# Patient Record
Sex: Male | Born: 1960 | Race: White | Hispanic: No | Marital: Single | Smoking: Never smoker
Health system: Southern US, Community
[De-identification: ages and names within clinical notes are randomized; demographics above are authoritative.]

## PROBLEM LIST (undated history)

## (undated) DIAGNOSIS — F419 Anxiety disorder, unspecified: Secondary | ICD-10-CM

## (undated) DIAGNOSIS — I447 Left bundle-branch block, unspecified: Secondary | ICD-10-CM

## (undated) DIAGNOSIS — I519 Heart disease, unspecified: Secondary | ICD-10-CM

## (undated) HISTORY — PX: KNEE SURGERY: SHX244

---

## 2016-12-28 ENCOUNTER — Encounter (HOSPITAL_BASED_OUTPATIENT_CLINIC_OR_DEPARTMENT_OTHER): Payer: Self-pay | Admitting: *Deleted

## 2016-12-28 ENCOUNTER — Emergency Department (HOSPITAL_BASED_OUTPATIENT_CLINIC_OR_DEPARTMENT_OTHER)
Admission: EM | Admit: 2016-12-28 | Discharge: 2016-12-28 | Disposition: A | Discharge status: Home / Self Care | Payer: Self-pay | Attending: Emergency Medicine | Admitting: Emergency Medicine

## 2016-12-28 ENCOUNTER — Emergency Department (HOSPITAL_BASED_OUTPATIENT_CLINIC_OR_DEPARTMENT_OTHER): Payer: Self-pay

## 2016-12-28 DIAGNOSIS — M25421 Effusion, right elbow: Secondary | ICD-10-CM | POA: Insufficient documentation

## 2016-12-28 NOTE — ED Provider Notes (Signed)
[facility]   CSN: [id number] Arrival date & time: 12/28/16  1200     History   Chief Complaint No chief complaint on file.   HPI Coleson Kant is a 56 y.o. male with no significant past medical history who presents a with chief complaint acute onset, moderately improving swelling to the right elbow. He states that on July 5 he was at work, working on an airplane above him with his right elbow leaning on the cement for 8 hours. He denies immediate onset of pain or swelling, but states that a few hours later he noted swelling to the right elbow. He denies any significant pain, numbness, tingling, or weakness. Has not tried anything for his symptoms. No aggravating or alleviating factors. No fevers or chills. Otherwise no trauma or falls.  The history is provided by the patient.    History reviewed. No pertinent past medical history.  There are no active problems to display for this patient.   Past Surgical History:  Procedure Laterality Date  . KNEE SURGERY         Home Medications    Prior to Admission medications   Not on File    Family History No family history on file.  Social History Social History  Substance Use Topics  . Smoking status: Never Smoker  . Smokeless tobacco: Never Used  . Alcohol use No     Allergies   Patient has no known allergies.   Review of Systems Review of Systems  Constitutional: Negative for chills and fever.  Musculoskeletal: Positive for joint swelling.  Neurological: Negative for weakness and numbness.     Physical Exam Updated Vital Signs BP (!) 144/93   Pulse 76   Temp 98.1 F (36.7 C) (Oral)   Resp 20   Ht 5\' 10"  (1.778 m)   Wt 106.6 kg (235 lb)   SpO2 98%   BMI 33.72 kg/m   Physical Exam  Constitutional: He appears well-developed and well-nourished. No distress.  HENT:  Head: Normocephalic and atraumatic.  Eyes: Conjunctivae are normal. Right eye exhibits no discharge. Left  eye exhibits no discharge.  Neck: No JVD present. No tracheal deviation present.  Cardiovascular: Normal rate.   2+ radial pulses bilaterally  Pulmonary/Chest: Effort normal.  Abdominal: He exhibits no distension.  Musculoskeletal: Normal range of motion. He exhibits no edema.       Right elbow: He exhibits effusion. He exhibits normal range of motion. No tenderness found.  10 cm x 5cm area of fluctuance and swelling at the area of the right olecranon bursa. Full range of motion of right elbow, 5/5 strength of BUE major muscle groups. No crepitus, erythema, or tenderness elicited   Neurological: He is alert. No sensory deficit.  Fluent speech, no facial droop, sensation intact to soft touch of BUE  Skin: Skin is warm and dry. No erythema.  Psychiatric: He has a normal mood and affect. His behavior is normal.  Nursing note and vitals reviewed.    ED Treatments / Results  Labs (all labs ordered are listed, but only abnormal results are displayed) Labs Reviewed - No data to display  EKG  EKG Interpretation None       Radiology Dg Elbow Complete Right  Result Date: 12/28/2016 CLINICAL DATA:  Right elbow swelling.  No reported injury. EXAM: RIGHT ELBOW - COMPLETE 3+ VIEW COMPARISON:  None. FINDINGS: No fracture, joint effusion or dislocation. No suspicious focal osseous lesion. Dorsal/radial side right elbow soft tissue  swelling. Small enthesophytes at the medial epicondyles in the right distal humerus. Mild osteoarthritis in the right elbow joint. No radiopaque foreign body. IMPRESSION: Dorsal/ radial side right elbow soft tissue swelling. No joint effusion, fracture or malalignment. Small enthesophytes at the medial epicondyle in the right distal humerus. Mild right elbow osteoarthritis . Electronically Signed   By: Delbert PhenixJason A Poff M.D.   On: 12/28/2016 12:37    Procedures Procedures (including critical care time)  Medications Ordered in ED Medications - No data to  display   Initial Impression / Assessment and Plan / ED Course  I have reviewed the triage vital signs and the nursing notes.  Pertinent labs & imaging results that were available during my care of the patient were reviewed by me and considered in my medical decision making (see chart for details).     Patient with swelling of the right olecranon bursa after prolonged pressure applied to the area 2 weeks ago. States the area has been improving slowly. Afebrile, vital signs are stable. No focal neurological deficits. Low suspicion of septic joint, gout, or bursitis in the absence of erythema or pain. X-ray reviewed by me shows right elbow soft tissue swelling with no joint effusion, fracture, or malalignment. There is evidence of mild right elbow osteoarthritis. He is stable for discharge home with Ace wrap for compression, RICE therapy indicated and discussed. 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 He will follow up with Dr. Pearletha ForgeHudnall the orthopedist for reevaluation if symptoms persist. Discussed indications for immediate return to the ED. Pt verbalized understanding of and agreement with plan and is safe for discharge home at this time.   Final Clinical Impressions(s) / ED Diagnoses   Final diagnoses:  Effusion of right olecranon bursa    New Prescriptions There are no discharge medications for this patient.    Jeanie SewerFawze, Taeshawn Helfman A, PA-C 12/28/16 1324    Geoffery Lyonselo, Douglas, MD 12/30/16 0221

## 2016-12-28 NOTE — ED Notes (Signed)
ED Provider at bedside. 

## 2016-12-28 NOTE — Discharge Instructions (Signed)
Apply compression to the elbow with an Ace wrap. Use elbow pads while at work to avoid excess pressure. He may take ibuprofen or Tylenol as needed for pain. Follow-up with orthopedics for reevaluation if symptoms persist. Return to the ED immediately if any concerning signs or symptoms develop such as fevers, redness, tenderness, or severe pain to the affected area.

## 2016-12-28 NOTE — ED Triage Notes (Signed)
He was standing under an airplane at work with the elbow leaning on cement. His right elbow is swollen. Happened July 5th. He is not sure it is workman's. To late to do a UDS per place of employment.

## 2018-01-22 ENCOUNTER — Emergency Department (HOSPITAL_BASED_OUTPATIENT_CLINIC_OR_DEPARTMENT_OTHER): Payer: Self-pay

## 2018-01-22 ENCOUNTER — Encounter (HOSPITAL_BASED_OUTPATIENT_CLINIC_OR_DEPARTMENT_OTHER): Payer: Self-pay | Admitting: *Deleted

## 2018-01-22 ENCOUNTER — Emergency Department (HOSPITAL_BASED_OUTPATIENT_CLINIC_OR_DEPARTMENT_OTHER)
Admission: EM | Admit: 2018-01-22 | Discharge: 2018-01-22 | Disposition: A | Discharge status: Home / Self Care | Payer: Self-pay | Attending: Emergency Medicine | Admitting: Emergency Medicine

## 2018-01-22 ENCOUNTER — Other Ambulatory Visit: Payer: Self-pay

## 2018-01-22 DIAGNOSIS — R0989 Other specified symptoms and signs involving the circulatory and respiratory systems: Secondary | ICD-10-CM

## 2018-01-22 DIAGNOSIS — F458 Other somatoform disorders: Secondary | ICD-10-CM | POA: Insufficient documentation

## 2018-01-22 HISTORY — DX: Heart disease, unspecified: I51.9

## 2018-01-22 HISTORY — DX: Left bundle-branch block, unspecified: I44.7

## 2018-01-22 HISTORY — DX: Anxiety disorder, unspecified: F41.9

## 2018-01-22 LAB — CBC WITH DIFFERENTIAL/PLATELET
BASOS PCT: 0 %
Basophils Absolute: 0 10*3/uL (ref 0.0–0.1)
EOS PCT: 2 %
Eosinophils Absolute: 0.1 10*3/uL (ref 0.0–0.7)
HEMATOCRIT: 46.5 % (ref 39.0–52.0)
HEMOGLOBIN: 16.3 g/dL (ref 13.0–17.0)
LYMPHS PCT: 28 %
Lymphs Abs: 1.8 10*3/uL (ref 0.7–4.0)
MCH: 29.3 pg (ref 26.0–34.0)
MCHC: 35.1 g/dL (ref 30.0–36.0)
MCV: 83.6 fL (ref 78.0–100.0)
MONOS PCT: 8 %
Monocytes Absolute: 0.5 10*3/uL (ref 0.1–1.0)
NEUTROS ABS: 4.1 10*3/uL (ref 1.7–7.7)
Neutrophils Relative %: 62 %
Platelets: 184 10*3/uL (ref 150–400)
RBC: 5.56 MIL/uL (ref 4.22–5.81)
RDW: 13.6 % (ref 11.5–15.5)
WBC: 6.5 10*3/uL (ref 4.0–10.5)

## 2018-01-22 LAB — BASIC METABOLIC PANEL
ANION GAP: 9 (ref 5–15)
BUN: 14 mg/dL (ref 6–20)
CHLORIDE: 108 mmol/L (ref 98–111)
CO2: 24 mmol/L (ref 22–32)
Calcium: 8.8 mg/dL — ABNORMAL LOW (ref 8.9–10.3)
Creatinine, Ser: 0.98 mg/dL (ref 0.61–1.24)
GFR calc Af Amer: >60 mL/min (ref >60)
GFR calc non Af Amer: >60 mL/min (ref >60)
GLUCOSE: 109 mg/dL — AB (ref 70–99)
POTASSIUM: 3.8 mmol/L (ref 3.5–5.1)
Sodium: 141 mmol/L (ref 135–145)

## 2018-01-22 LAB — TROPONIN I: Troponin I: <0.03 ng/mL (ref <0.03)

## 2018-01-22 MED ORDER — LORAZEPAM 2 MG/ML IJ SOLN
1.0000 mg | Freq: Once | INTRAMUSCULAR | Status: AC
  Administered 2018-01-22: 1 mg via INTRAVENOUS
  Filled 2018-01-22: qty 1

## 2018-01-22 NOTE — ED Provider Notes (Signed)
  Physical Exam  BP (!) 169/92   Pulse 79   Temp 98.2 F (36.8 C) (Oral)   Resp 19   Ht 5\' 10"  (1.778 m)   Wt 104.3 kg   SpO2 98%   BMI 33.00 kg/m   Physical Exam  ED Course/Procedures     Procedures  MDM  Work-up reassuring.  Negative x-rays and negative lab work.  EKG reassuring.  Discharge home.       Benjiman CorePickering, Daniah Zaldivar, MD 01/22/18 [phone]

## 2018-01-22 NOTE — ED Triage Notes (Signed)
Pt here for sore throat, difficulty swallowing phlegm, increased work in swallowing, increased anxiety, also mentions CP, sob, "feel near-syncopal", dizziness getting out of car upon arrival, (denies: abd or back pain, NVD, diaphoresis, fever, recent sickness), admits to h/o similar ongoing intermittently over 4 years.  Alert, NAD, calm, interactive, resps e/u, speaking in clear complete sentences, no dyspnea noted, skin W&D, VSS. EDP into room during triage. [facility]

## 2018-01-22 NOTE — Discharge Instructions (Addendum)
Follow-up with primary care doctor and with ENT as needed.

## 2018-01-22 NOTE — ED Provider Notes (Signed)
[facility]-EMER[facility]Y DEPT Provider Note: Lowella Dell, MD, FACEP  CSN: [id number] MRN: [id number] ARRIVAL: 01/22/18 at 0606 ROOM: [facility]   CHIEF COMPLAINT  Sore Throat   HISTORY OF PRESENT ILLNESS  01/22/18 6:37 AM Don Floyd is a 57 y.o. male with a history of left bundle branch block and some type of heart disease, perhaps reduced ejection fraction.  He is supposed to be taking lisinopril and Coreg but is not taking these.  He has been off them for several years.  He has not had a primary care physician since moving here from [place] 2-1/2 years ago.  He has a long-standing (years) history of what he believes is postnasal drip causing a sensation of phlegm obstructing his throat.  This occurs at night and is worst in the supine position.  He usually sleeps on his side as a result of this.  He regularly uses throat lozenges, [place] and other over-the-counter aids to assist in his symptoms.  He was horse playing with a relative 2 days ago and was choked.  This has caused an increase in his symptoms.  The symptoms this morning were worse than usual and caused him to become very anxious.  He has a history of anxiety.  He feels a tightness in his chest and a subjective sense of difficulty breathing and swallowing.  He denies nausea or diaphoresis.  He denies frank pain.   His significant other denies history of snoring or apneic episodes in his sleep.  The patient denies a history of acidic or sour taste in his throat.  He denies a history of GERD.   Past Medical History:  Diagnosis Date  . Anxiety   . Heart disease    Reduced ejection fraction?  Marland Kitchen LBBB (left bundle branch block)     Past Surgical History:  Procedure Laterality Date  . KNEE SURGERY      History reviewed. No pertinent family history.  Social History   Tobacco Use  . Smoking status: Never Smoker  . Smokeless tobacco: Never Used  Substance Use Topics  . Alcohol use: No  . Drug use: No    Prior to  Admission medications   Medication Sig Start Date End Date Taking? Authorizing Provider  Carvedilol (COREG PO) Take by mouth.   Yes [provider]  LISINOPRIL PO Take by mouth.   Yes [provider]    Allergies Patient has no known allergies.   REVIEW OF SYSTEMS  Negative except as noted here or in the History of Present Illness.   PHYSICAL EXAMINATION  Initial Vital Signs Blood pressure (!) 156/91, pulse 80, temperature 98.2 F (36.8 C), temperature source Oral, resp. rate 14, height 5\' 10"  (1.778 m), weight 104.3 kg, SpO2 100 %.  Examination General: Well-developed, well-nourished male in no acute distress; appearance consistent with age of record HENT: normocephalic; atraumatic; normal pharynx; no dysphonia; no stridor Eyes: pupils equal, round and reactive to light; extraocular muscles intact Neck: supple Heart: regular rate and rhythm; no murmur Lungs: clear to auscultation bilaterally; frequent cough Abdomen: soft; nondistended; nontender; bowel sounds present Extremities: No deformity; full range of motion; pulses normal Neurologic: Awake, alert and oriented; motor function intact in all extremities and symmetric; no facial droop Skin: Warm and dry Psychiatric: Anxious   RESULTS  Summary of this visit's results, reviewed by myself:   EKG Interpretation  Date/Time:  Sunday January 22 2018 06:12:25 EDT Ventricular Rate:  73 PR Interval:    QRS Duration: 166 QT  Interval:  446 QTC Calculation: 492 R Axis:   -21 Text Interpretation:  Sinus rhythm Left bundle branch block No previous ECGs available Confirmed by Carzell Saldivar ([id number]) on 01/22/2018 6:17:42 AM      Laboratory Studies: Results for orders placed or performed during the hospital encounter of 01/22/18 (from the past 24 hour(s))  CBC with Differential/Platelet     Status: None   Collection Time: 01/22/18  6:20 AM  Result Value Ref Range   WBC 6.5 4.0 - 10.5 K/uL   RBC 5.56 4.22 - 5.81  MIL/uL   Hemoglobin 16.3 13.0 - 17.0 g/dL   HCT 60.4 54.0 - 98.1 %   MCV 83.6 78.0 - 100.0 fL   MCH 29.3 26.0 - 34.0 pg   MCHC 35.1 30.0 - 36.0 g/dL   RDW 19.1 47.8 - 29.5 %   Platelets 184 150 - 400 K/uL   Neutrophils Relative % 62 %   Lymphocytes Relative 28 %   Monocytes Relative 8 %   Eosinophils Relative 2 %   Basophils Relative 0 %   Neutro Abs 4.1 1.7 - 7.7 K/uL   Lymphs Abs 1.8 0.7 - 4.0 K/uL   Monocytes Absolute 0.5 0.1 - 1.0 K/uL   Eosinophils Absolute 0.1 0.0 - 0.7 K/uL   Basophils Absolute 0.0 0.0 - 0.1 K/uL   WBC Morphology ATYPICAL LYMPHOCYTES    Smear Review PLATELET COUNT CONFIRMED BY SMEAR   Basic metabolic panel     Status: Abnormal   Collection Time: 01/22/18  6:20 AM  Result Value Ref Range   Sodium 141 135 - 145 mmol/L   Potassium 3.8 3.5 - 5.1 mmol/L   Chloride 108 98 - 111 mmol/L   CO2 24 22 - 32 mmol/L   Glucose, Bld 109 (H) 70 - 99 mg/dL   BUN 14 6 - 20 mg/dL   Creatinine, Ser 6.21 0.61 - 1.24 mg/dL   Calcium 8.8 (L) 8.9 - 10.3 mg/dL   GFR calc non Af Amer >60 >60 mL/min   GFR calc Af Amer >60 >60 mL/min   Anion gap 9 5 - 15  Troponin I     Status: None   Collection Time: 01/22/18  6:20 AM  Result Value Ref Range   Troponin I <0.03 <0.03 ng/mL   Imaging Studies: Dg Neck Soft Tissue  Result Date: 01/22/2018 CLINICAL DATA:  Sore throat, difficulty swallowing EXAM: NECK SOFT TISSUES - 1+ VIEW COMPARISON:  None. FINDINGS: There is no evidence of retropharyngeal soft tissue swelling or epiglottic enlargement. The cervical airway is unremarkable and no radio-opaque foreign body identified. IMPRESSION: Negative. Electronically Signed   By: Charline Bills M.D.   On: 01/22/2018 07:31   Dg Chest 2 View  Result Date: 01/22/2018 CLINICAL DATA:  Sore throat, difficulty swallowing, chest pain EXAM: CHEST - 2 VIEW COMPARISON:  None. FINDINGS: Lungs are clear.  No pleural effusion or pneumothorax. The heart is normal in size. Visualized osseous structures are  within normal limits. IMPRESSION: No evidence of acute cardiopulmonary disease. Electronically Signed   By: Charline Bills M.D.   On: 01/22/2018 07:31    ED COURSE and MDM  Nursing notes and initial vitals signs, including pulse oximetry, reviewed.  Vitals:   01/22/18 0630 01/22/18 0700 01/22/18 0730 01/22/18 0800  BP: (!) 169/92 (!) 140/95 (!) 140/91 (!) 138/94  Pulse: 79 68 64 79  Resp: 19 10 17 14   Temp:      TempSrc:      SpO2:  98% 96% 94% 94%  Weight:      Height:        PROCEDURES    ED DIAGNOSES     ICD-10-CM   1. Globus sensation F45.8        Carmie Lanpher, MD 01/22/18 2234

## 2020-03-03 IMAGING — DX DG NECK SOFT TISSUE
2 series · 2 of 2 positions shown · non-contrast
Comparison: None.

CLINICAL DATA: Sore throat, difficulty swallowing

EXAM:
NECK SOFT TISSUES - 1+ VIEW

[neck lat]
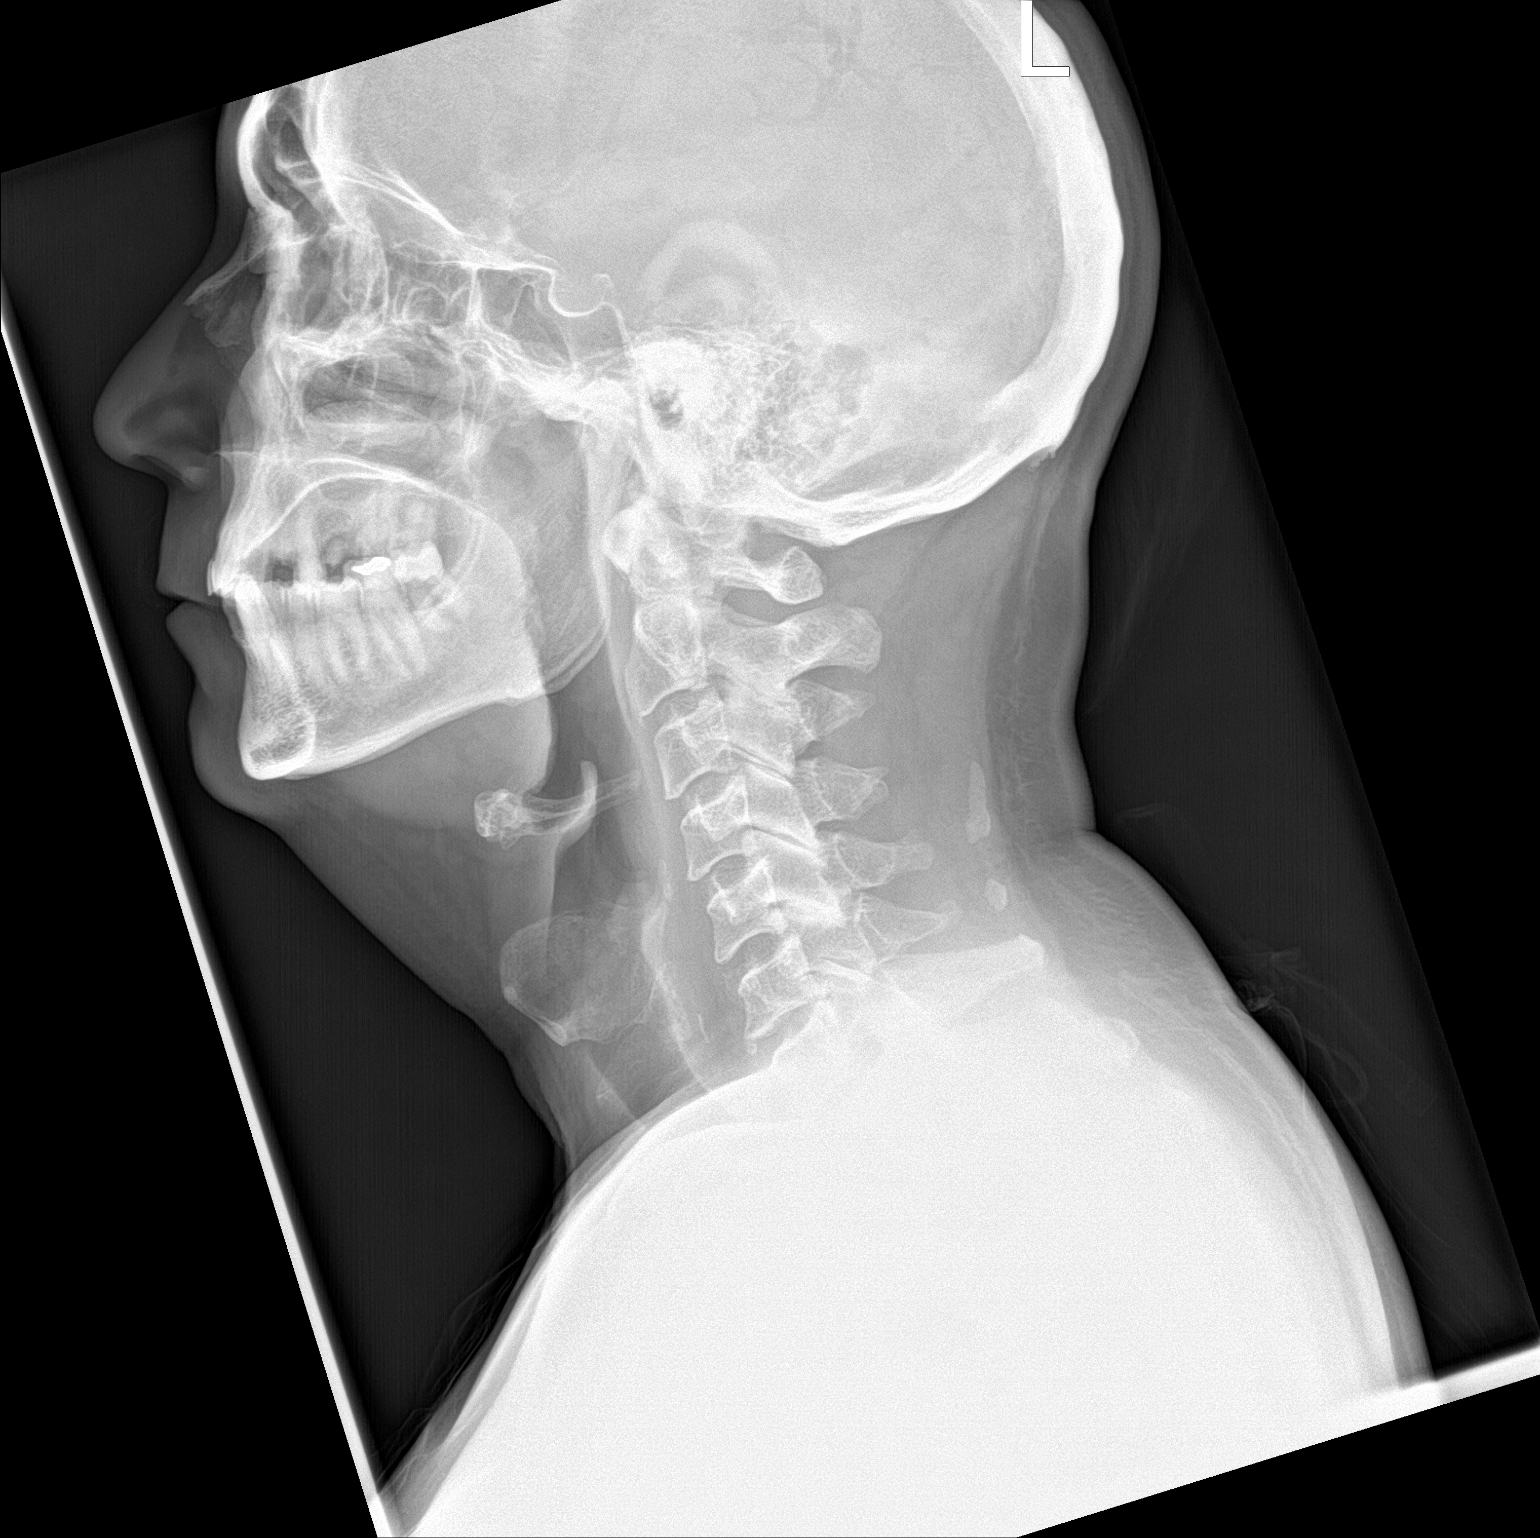

[neck ap]
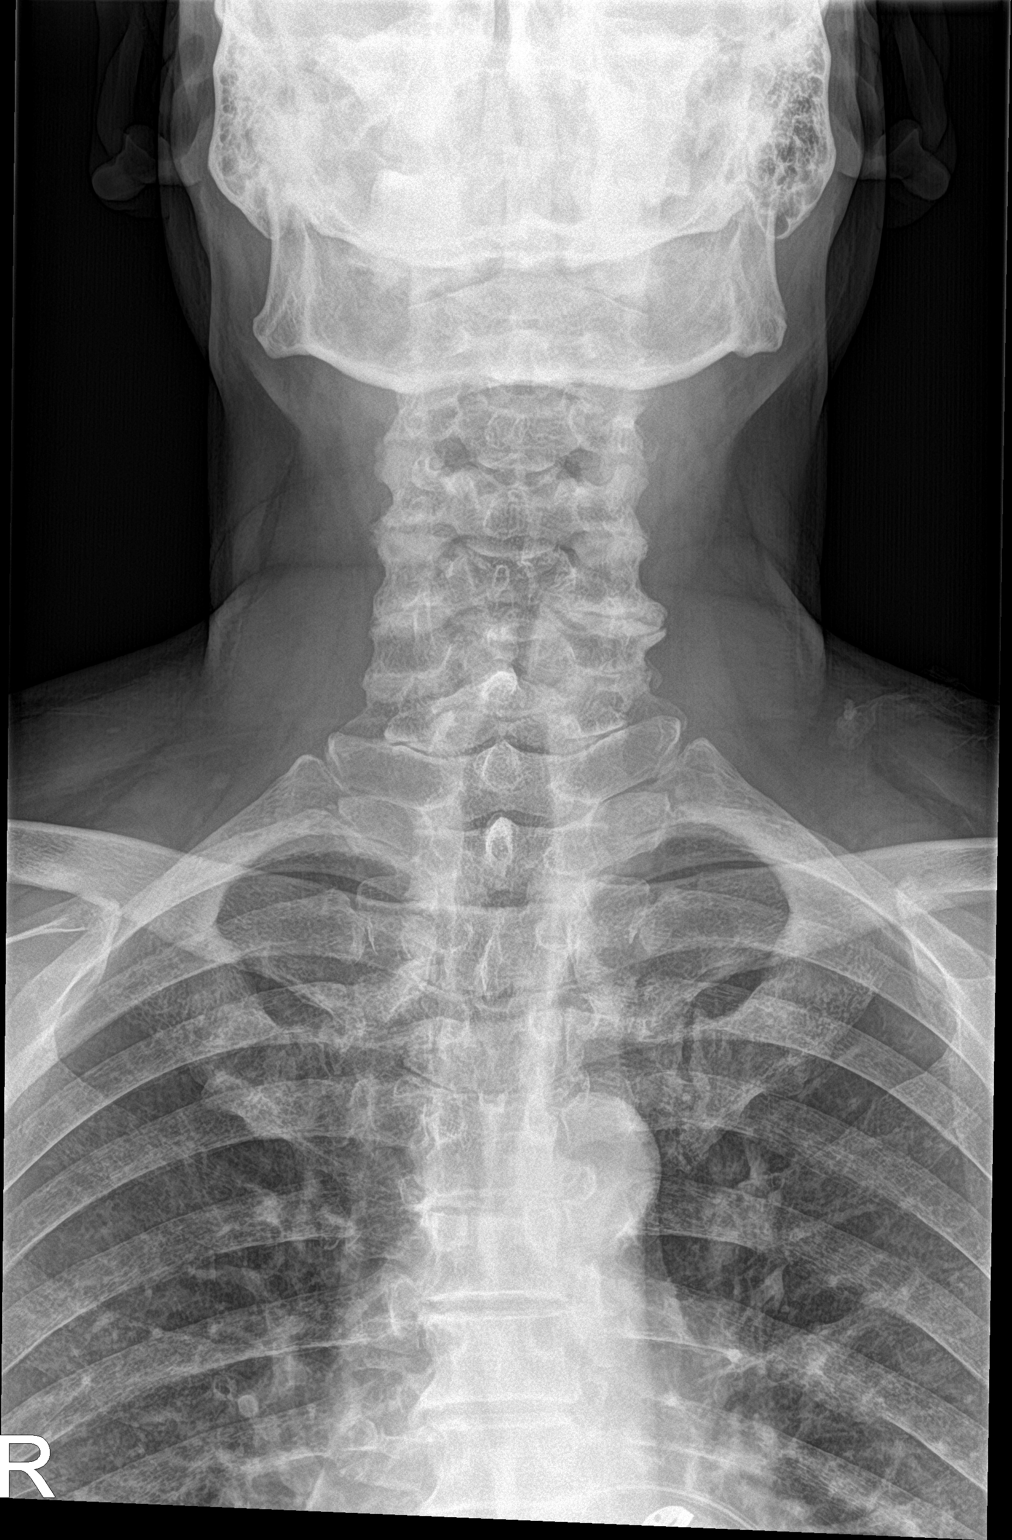

[2 of 2 positions shown; findings below may reference images not displayed]

FINDINGS: There is no evidence of retropharyngeal soft tissue swelling or
epiglottic enlargement. The cervical airway is unremarkable and no
radio-opaque foreign body identified.
IMPRESSION: Negative.
